# Patient Record
Sex: Female | Born: 1935 | Race: White | Hispanic: No | Marital: Married
Health system: Southern US, Community
[De-identification: ages and names within clinical notes are randomized; demographics above are authoritative.]

---

## 2012-11-22 ENCOUNTER — Emergency Department: Payer: Self-pay | Admitting: Emergency Medicine

## 2012-11-22 LAB — URINALYSIS, COMPLETE
Bilirubin,UR: NEGATIVE
Blood: NEGATIVE
Glucose,UR: NEGATIVE mg/dL (ref 0–75)
Leukocyte Esterase: NEGATIVE
Protein: 30
Squamous Epithelial: 3

## 2012-11-22 LAB — COMPREHENSIVE METABOLIC PANEL
Albumin: 4.4 g/dL (ref 3.4–5.0)
Anion Gap: 6 — ABNORMAL LOW (ref 7–16)
BUN: 22 mg/dL — ABNORMAL HIGH (ref 7–18)
Chloride: 107 mmol/L (ref 98–107)
Co2: 28 mmol/L (ref 21–32)
Creatinine: 0.89 mg/dL (ref 0.60–1.30)
EGFR (Non-African Amer.): >60
Glucose: 82 mg/dL (ref 65–99)
Osmolality: 284 (ref 275–301)
SGPT (ALT): 35 U/L (ref 12–78)
Sodium: 141 mmol/L (ref 136–145)

## 2012-11-22 LAB — DRUG SCREEN, URINE
Amphetamines, Ur Screen: NEGATIVE (ref ?–1000)
Benzodiazepine, Ur Scrn: NEGATIVE (ref ?–200)
MDMA (Ecstasy)Ur Screen: NEGATIVE (ref ?–500)
Methadone, Ur Screen: NEGATIVE (ref ?–300)
Opiate, Ur Screen: NEGATIVE (ref ?–300)
Phencyclidine (PCP) Ur S: NEGATIVE (ref ?–25)

## 2012-11-22 LAB — ETHANOL: Ethanol %: <0.003 % (ref 0.000–0.080)

## 2012-11-22 LAB — TSH: Thyroid Stimulating Horm: 2.44 u[IU]/mL

## 2012-11-22 LAB — CBC
MCH: 31.4 pg (ref 26.0–34.0)
Platelet: 208 10*3/uL (ref 150–440)
RBC: 4.19 10*6/uL (ref 3.80–5.20)
RDW: 13.1 % (ref 11.5–14.5)

## 2013-02-23 ENCOUNTER — Ambulatory Visit: Payer: Self-pay | Admitting: Internal Medicine

## 2013-02-26 LAB — CBC
MCV: 92 fL (ref 80–100)
RBC: 3.46 10*6/uL — ABNORMAL LOW (ref 3.80–5.20)
RDW: 12.7 % (ref 11.5–14.5)
WBC: 13.7 10*3/uL — ABNORMAL HIGH (ref 3.6–11.0)

## 2013-02-26 LAB — TROPONIN I: Troponin-I: <0.02 ng/mL

## 2013-02-26 LAB — COMPREHENSIVE METABOLIC PANEL
Alkaline Phosphatase: 101 U/L (ref 50–136)
Anion Gap: 5 — ABNORMAL LOW (ref 7–16)
BUN: 16 mg/dL (ref 7–18)
Calcium, Total: 9 mg/dL (ref 8.5–10.1)
Chloride: 106 mmol/L (ref 98–107)
Co2: 29 mmol/L (ref 21–32)
EGFR (African American): >60
Glucose: 95 mg/dL (ref 65–99)
Potassium: 3.4 mmol/L — ABNORMAL LOW (ref 3.5–5.1)
Sodium: 140 mmol/L (ref 136–145)
Total Protein: 7.2 g/dL (ref 6.4–8.2)

## 2013-02-26 LAB — URINALYSIS, COMPLETE
Nitrite: NEGATIVE
RBC,UR: <1 /HPF (ref 0–5)
Specific Gravity: 1.029 (ref 1.003–1.030)
WBC UR: 2 /HPF (ref 0–5)

## 2013-02-26 LAB — CK TOTAL AND CKMB (NOT AT ARMC): CK-MB: 1.7 ng/mL (ref 0.5–3.6)

## 2013-02-27 ENCOUNTER — Inpatient Hospital Stay: Payer: Self-pay | Admitting: Internal Medicine

## 2013-02-27 LAB — SEDIMENTATION RATE: Erythrocyte Sed Rate: 56 mm/hr — ABNORMAL HIGH (ref 0–30)

## 2013-02-28 LAB — CBC WITH DIFFERENTIAL/PLATELET
Basophil %: 0.3 %
HCT: 29.7 % — ABNORMAL LOW (ref 35.0–47.0)
HGB: 10 g/dL — ABNORMAL LOW (ref 12.0–16.0)
MCH: 30.9 pg (ref 26.0–34.0)
MCHC: 33.6 g/dL (ref 32.0–36.0)
MCV: 92 fL (ref 80–100)
Monocyte #: 0.8 x10 3/mm (ref 0.2–0.9)
Neutrophil #: 7 10*3/uL — ABNORMAL HIGH (ref 1.4–6.5)
RDW: 12.8 % (ref 11.5–14.5)
WBC: 9.7 10*3/uL (ref 3.6–11.0)

## 2013-02-28 LAB — BASIC METABOLIC PANEL
Anion Gap: 6 — ABNORMAL LOW (ref 7–16)
BUN: 9 mg/dL (ref 7–18)
Chloride: 114 mmol/L — ABNORMAL HIGH (ref 98–107)
Co2: 26 mmol/L (ref 21–32)
EGFR (African American): >60
EGFR (Non-African Amer.): >60
Glucose: 94 mg/dL (ref 65–99)
Osmolality: 289 (ref 275–301)
Sodium: 146 mmol/L — ABNORMAL HIGH (ref 136–145)

## 2013-03-04 LAB — CULTURE, BLOOD (SINGLE)

## 2013-03-16 ENCOUNTER — Inpatient Hospital Stay: Payer: Self-pay | Admitting: Specialist

## 2013-03-16 LAB — BASIC METABOLIC PANEL
BUN: 17 mg/dL (ref 7–18)
Calcium, Total: 9.2 mg/dL (ref 8.5–10.1)
Co2: 30 mmol/L (ref 21–32)
EGFR (African American): >60
Glucose: 101 mg/dL — ABNORMAL HIGH (ref 65–99)
Osmolality: 283 (ref 275–301)
Potassium: 4.1 mmol/L (ref 3.5–5.1)
Sodium: 141 mmol/L (ref 136–145)

## 2013-03-16 LAB — PROTIME-INR
INR: 1.1
Prothrombin Time: 13.9 secs (ref 11.5–14.7)

## 2013-03-16 LAB — CBC
HCT: 32.8 % — ABNORMAL LOW (ref 35.0–47.0)
MCH: 31 pg (ref 26.0–34.0)
MCV: 92 fL (ref 80–100)
Platelet: 230 10*3/uL (ref 150–440)
RBC: 3.58 10*6/uL — ABNORMAL LOW (ref 3.80–5.20)
RDW: 13.2 % (ref 11.5–14.5)
WBC: 6.8 10*3/uL (ref 3.6–11.0)

## 2013-03-16 LAB — APTT: Activated PTT: 30 secs (ref 23.6–35.9)

## 2013-03-17 LAB — BASIC METABOLIC PANEL
Anion Gap: 3 — ABNORMAL LOW (ref 7–16)
BUN: 12 mg/dL (ref 7–18)
Calcium, Total: 9 mg/dL (ref 8.5–10.1)
Co2: 28 mmol/L (ref 21–32)
Creatinine: 0.62 mg/dL (ref 0.60–1.30)
EGFR (African American): >60
Glucose: 109 mg/dL — ABNORMAL HIGH (ref 65–99)
Sodium: 138 mmol/L (ref 136–145)

## 2013-03-17 LAB — URINALYSIS, COMPLETE
Bilirubin,UR: NEGATIVE
Glucose,UR: NEGATIVE mg/dL (ref 0–75)
Nitrite: NEGATIVE
Protein: 30
Specific Gravity: 1.017 (ref 1.003–1.030)
Squamous Epithelial: NONE SEEN

## 2013-03-17 LAB — CBC WITH DIFFERENTIAL/PLATELET
Basophil #: 0 10*3/uL (ref 0.0–0.1)
Basophil %: 0.3 %
Eosinophil %: 0.1 %
HCT: 33.7 % — ABNORMAL LOW (ref 35.0–47.0)
Lymphocyte #: 0.7 10*3/uL — ABNORMAL LOW (ref 1.0–3.6)
Lymphocyte %: 5.4 %
MCH: 30.7 pg (ref 26.0–34.0)
MCHC: 33.6 g/dL (ref 32.0–36.0)
MCV: 92 fL (ref 80–100)
Neutrophil #: 11 10*3/uL — ABNORMAL HIGH (ref 1.4–6.5)
WBC: 12.4 10*3/uL — ABNORMAL HIGH (ref 3.6–11.0)

## 2013-03-18 LAB — CBC WITH DIFFERENTIAL/PLATELET
Eosinophil %: 0.1 %
HCT: 30.5 % — ABNORMAL LOW (ref 35.0–47.0)
HGB: 10.1 g/dL — ABNORMAL LOW (ref 12.0–16.0)
Lymphocyte #: 0.8 10*3/uL — ABNORMAL LOW (ref 1.0–3.6)
Lymphocyte %: 5.9 %
Neutrophil #: 12.3 10*3/uL — ABNORMAL HIGH (ref 1.4–6.5)
Neutrophil %: 88.3 %
RDW: 12.6 % (ref 11.5–14.5)
WBC: 14 10*3/uL — ABNORMAL HIGH (ref 3.6–11.0)

## 2013-03-18 LAB — BASIC METABOLIC PANEL
Calcium, Total: 8.6 mg/dL (ref 8.5–10.1)
EGFR (African American): >60
EGFR (Non-African Amer.): >60
Sodium: 136 mmol/L (ref 136–145)

## 2013-03-19 LAB — URINE CULTURE

## 2013-03-26 ENCOUNTER — Ambulatory Visit: Payer: Self-pay | Admitting: Internal Medicine

## 2013-04-25 ENCOUNTER — Ambulatory Visit: Payer: Self-pay | Admitting: Internal Medicine

## 2013-04-25 DEATH — deceased

## 2014-04-03 IMAGING — CR PELVIS - 1-2 VIEW
1 series · 1 of 1 positions shown · non-contrast
Comparison: none

REASON FOR EXAM: post op in PACU
COMMENTS:   Bedside (portable):Y

PROCEDURE:     DXR - DXR PELVIS AP ONLY  - March 17, 2013  [DATE]
RESULT:     Comparison: 03/16/2013

[ap]
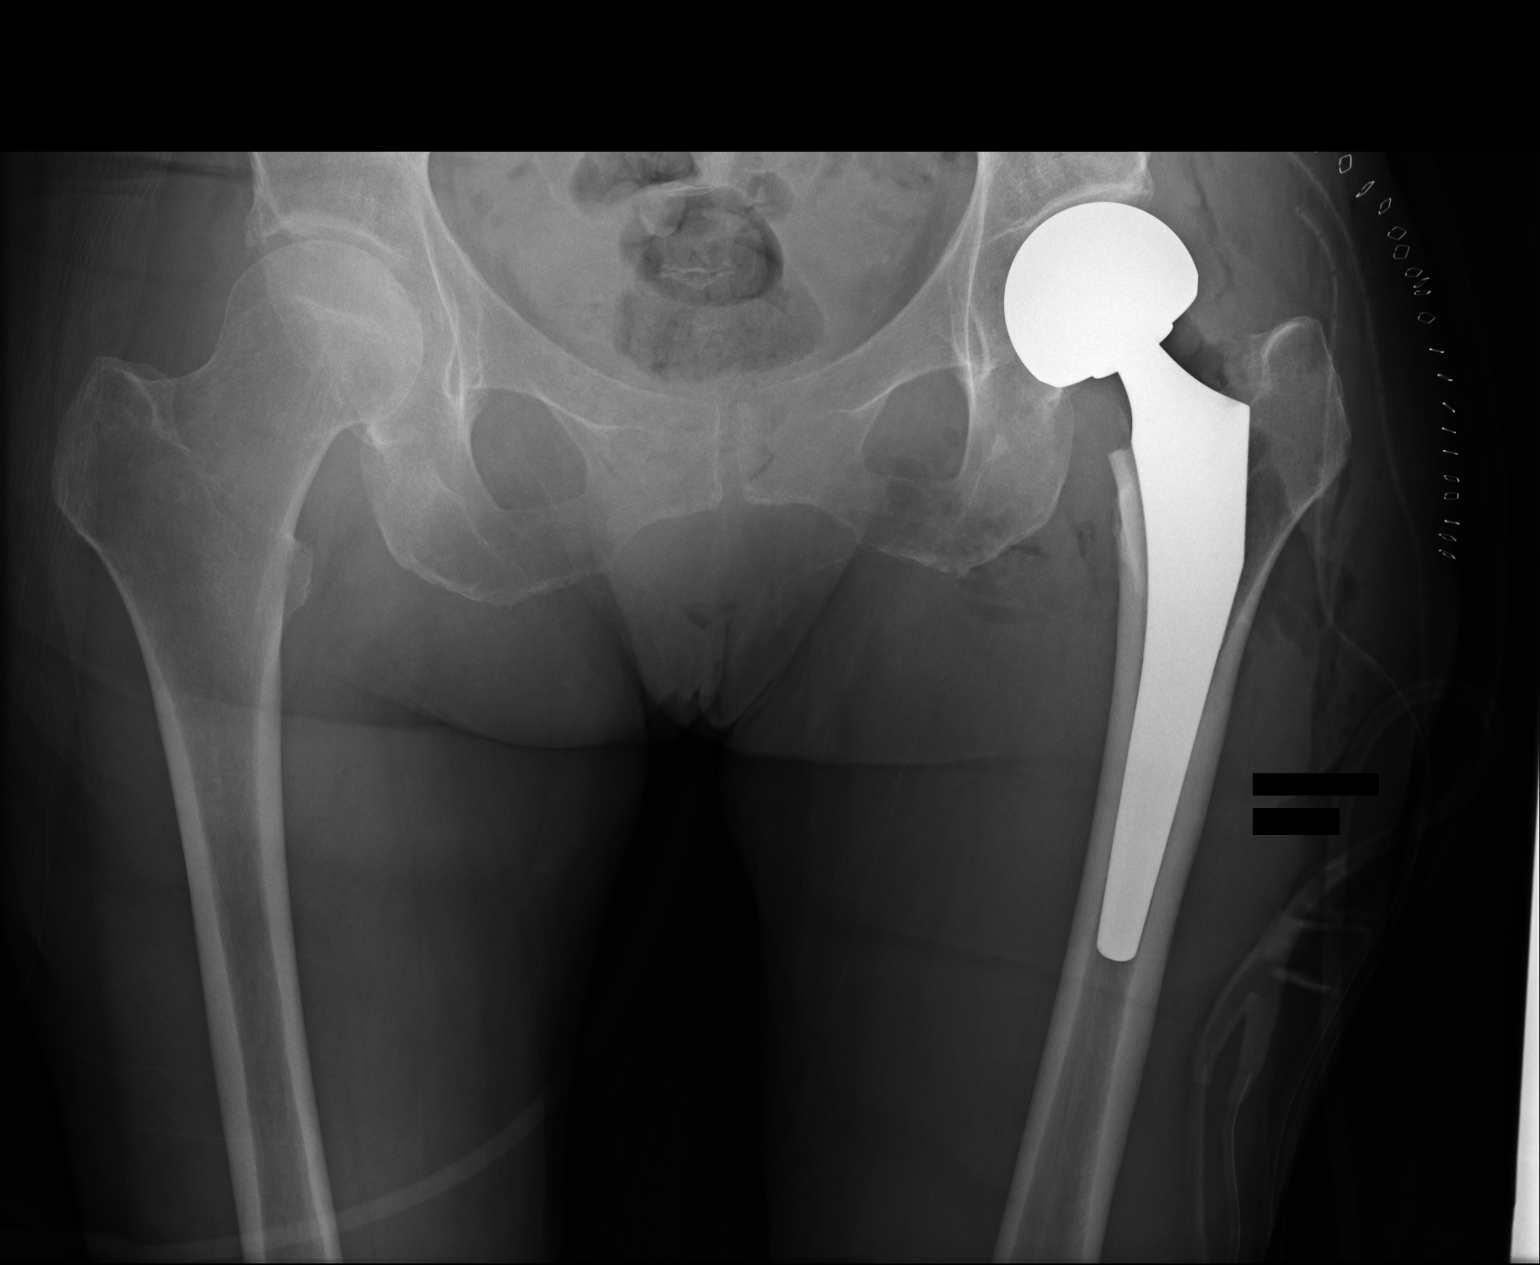

[1 of 1 positions shown; findings below may reference images not displayed]

FINDINGS: Hardware seen from left hip arthroplasty. Surgical drain and skin stables
are present. Soft tissue gas is likely postoperative.
IMPRESSION: Left hip arthroplasty.

[REDACTED]

## 2014-04-03 IMAGING — CR DG HIP 1V*L*
1 series · 1 of 1 positions shown · non-contrast
Comparison: none

REASON FOR EXAM: post op
COMMENTS:   Bedside (portable):Y

PROCEDURE:     DXR - DXR HIP LEFT ONE VIEW  - March 17, 2013  [DATE]
RESULT:     Comparison: 03/16/2013
Funnies:
Hardware seen from left hip arthroplasty. Skin staples and surgical drain
are present.

[lat]
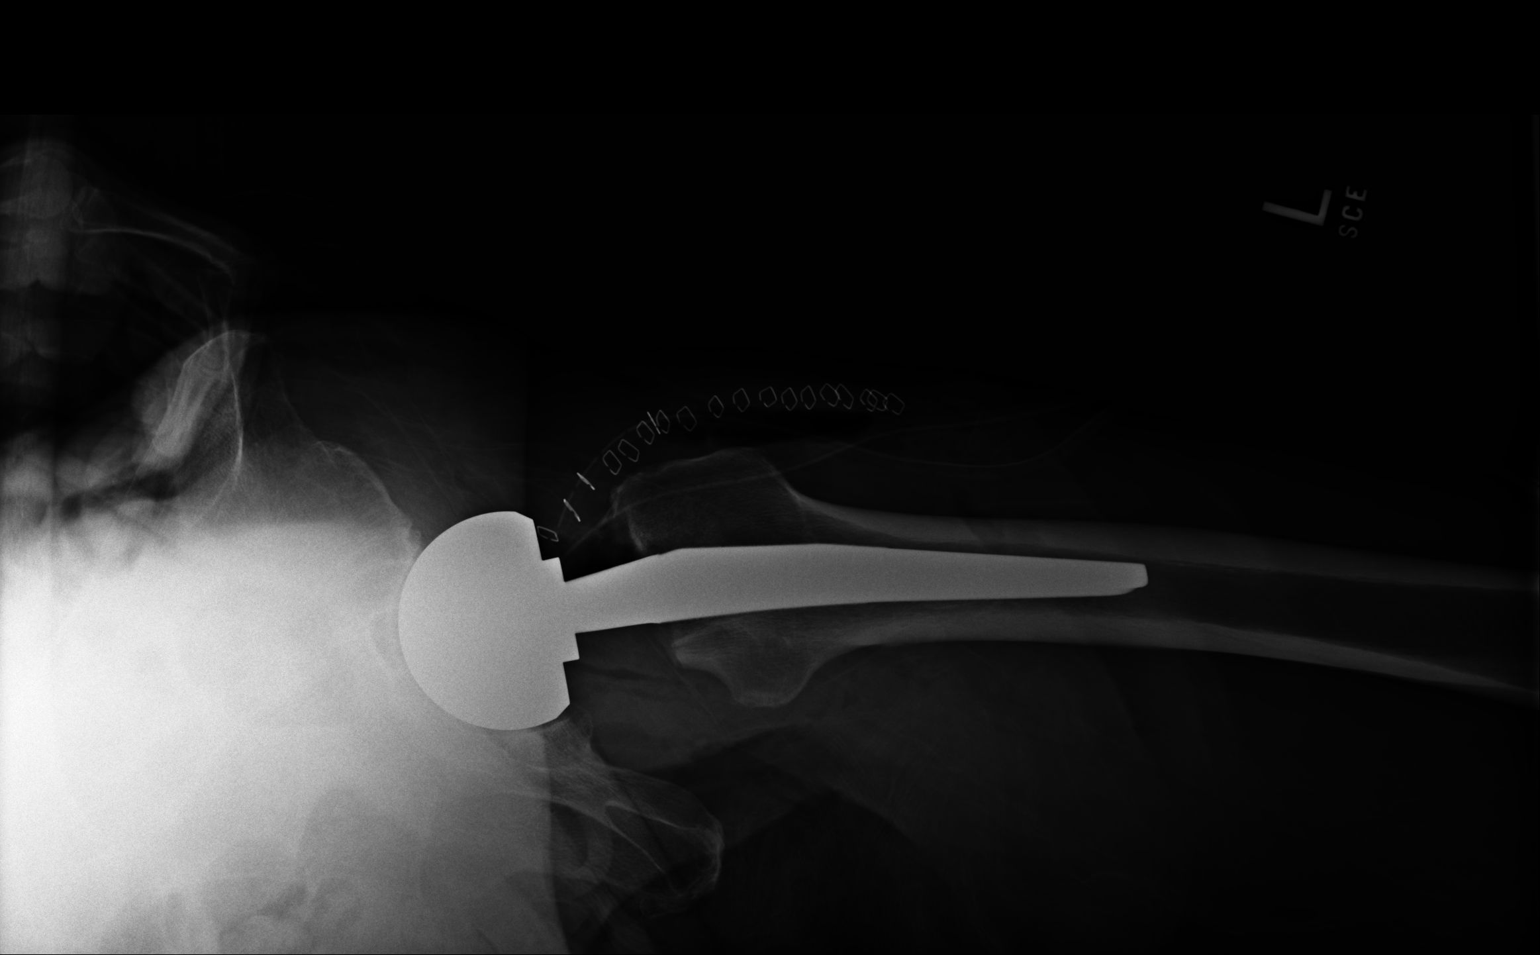

[1 of 1 positions shown; findings below may reference images not displayed]

IMPRESSION: Left hip arthroplasty.

[REDACTED]

## 2015-02-15 NOTE — Consult Note (Signed)
PATIENT NAME:  Molly Gordon, Molly Gordon MR#:  [id number] DATE OF BIRTH:  02-23-1934  DATE OF CONSULTATION:  03/16/2013  PRIMARY CARE PHYSICIAN:  Dr. Terance Hart  REFERRING PHYSICIAN:  Dr. Deeann Saint  CONSULTING PHYSICIAN:  Aamir Mclinden A. Allena Katz, MD  REASON FOR CONSULTATION:  Preop medical clearance.  The patient had sustained a left femoral fracture after accidental/mechanical fall.   HISTORY OF PRESENT ILLNESS: The patient is a pleasant, 81 year old, Caucasian female with Alzheimer's dementia, comes to the Emergency Room accompanied by her husband, who gives most of the history. The patient has dementia and not able to give any history or review of systems. Per husband, the patient was at their granddaughter's place and the patient was trying to get out of the house, out on the porch, accidentally missed a step/she lost balance and landed on the ground. She did not lose consciousness, did not have any head injury. She started having left hip pain and brought to the Emergency Room where she was found to have left femoral displaced fracture. Internal medicine was consulted for preop medical clearance.   The patient's husband reports she has no cardiac disease, any pulmonary disease or any other medical problems other than Alzheimer's dementia. The patient currently denies any chest pain or shortness of breath. Review of systems very limited due to dementia. The patient denies any chest pain, shortness of breath. She complains of mild hip pain. Could not do a detailed review of systems at this time.    ALLERGIES: No known drug allergies.   HOME MEDICATIONS: 1. Trazodone  100 mg 1-1/2 tablets at bedtime.  2. Risperdal 0.5 mg 2 tablets twice a day.  3. Lorazepam 0.5 mg 1 tablet twice a day.  4. Donepezil 5 mg 1 tablet at bedtime.  5. Ambien 5 mg at bedtime.   SOCIAL HISTORY: Lives at home with her husband. Nonsmoker. Nonalcoholic.   FAMILY HISTORY: Positive for hypertension.   PHYSICAL EXAMINATION: GENERAL:  The patient is awake, alert, oriented x 3, not in acute distress.   VITAL SIGNS: She is afebrile, pulse 80, blood pressure 141/73, sats are 96% on room air.   HEENT: Atraumatic, normocephalic. Pupils are equal, round and reactive to light and accommodation. EOM intact. Oral mucosa is moist.   NECK: Supple. No JVD. No carotid bruit.   LUNGS: Clear to auscultation bilaterally. No rales, rhonchi, respiratory distress or labored breathing.   HEART: Both the heart sounds are normal. Rate, rhythm regular. PMI not lateralized. Chest is nontender.   EXTREMITIES: Good pedal pulses, good femoral pulses. No lower extremity edema.   ABDOMEN: Soft, benign, nontender. No organomegaly. Positive bowel sounds.   NEUROLOGIC: Grossly intact cranial nerves II through XII. The patient is able to move all extremities well other than her left lower extremity. No focal neuro deficit noted.   SKIN: Warm and dry.   PSYCHIATRIC: The patient is awake. She is alert x 1.   LABORATORY DATA: PT-INR within normal limits. CBC: Hemoglobin and hematocrit is 11.1 and 32.8. White count is 6.8, platelet count 230. Glucose is 107, BUN 17, creatinine 0.9, sodium 141, potassium is 4.1, chloride 108, bicarbonate is 30. AP pelvis shows displaced left femoral neck fracture.   Chest x-ray shows minimal opacities in the right midlung, which is decreased from prior.   ASSESSMENT: A 80 year old patient with history of Alzheimer's disease and dementia comes into the Emergency Room after mechanical fall sustained a left femoral neck fracture. Internal medicine consulted for:  1. Preop medical clearance. The  patient is at low risk for surgery. She has no cardiac disease or any other underlying medical problems. Her EKG shows normal sinus rhythm with left axis deviation.  2. Left femoral neck fracture status post mechanical fall per Dr. Hyacinth MeekerMiller.  3. Recommended deep vein thrombosis prophylaxis, per Dr. Hyacinth MeekerMiller.  4. Mild elevated blood  pressure diagnosis of hypertension, likely due to stress from fracture and pain. We will continue to monitor if needed, consider starting antihypertensives.  5. CODE STATUS: Addressed and husband states the patient is a NO CODE, DO NOT RESUSCITATE.    TIME SPENT: 55 minutes.      ____________________________ Wylie HailSona A. Allena KatzPatel, MD sap:rw D: 03/16/2013 18:15:27 ET T: 03/16/2013 20:02:56 ET JOB#: [id number]  cc: Enzo Treu A. Allena KatzPatel, MD, <Dictator> Teena Iraniavid M. Terance HartBronstein, MD Valinda HoarHoward E. Miller, MD   Willow OraSONA A Frida Wahlstrom MD ELECTRONICALLY SIGNED 03/23/2013 14:50

## 2015-02-15 NOTE — Discharge Summary (Signed)
PATIENT NAME:  Darryl LentSHAMBLEY, Evy MR#:  [id number] DATE OF BIRTH:  [birth year]  DATE OF ADMISSION:  03/16/2013 DATE OF DISCHARGE:  03/21/2013  FINAL DIAGNOSES:  1.  Displaced subcapital fracture, left hip.  2. Advanced Alzheimer's dementia.   OPERATIONS: 03/17/2013: Left hip hemiarthroplasty, with a Stryker Accolade prosthesis.   COMPLICATIONS: None.   CONSULTATION: [facility].   HISTORY OF PRESENT ILLNESS: The patient is a 81 year old female who fell at home the day of admission, injuring the left hip. The day of admission she was brought to the Emergency Room where exam and x-rays revealed a displaced subcapital fracture of her left hip. She was admitted for medical evaluation and surgery. The patient lives at home with her husband, but has significant dementia.   PAST MEDICAL HISTORY: Illnesses, as above.   ALLERGIES: None.   MEDICATIONS: Trazodone 100 mg, 1-/2 at bedtime, Risperdal 0.5 mg, 2 twice a day, lorazepam 0.5 mg, 1 twice a day, donepezil 5 mg, 1 at bedtime, Ambien 5 mg bedtime.   REVIEW OF SYSTEMS: Unremarkable.   FAMILY HISTORY: Positive for hypertension.   SOCIAL HISTORY: The patient lives with her husband. Does not smoke or drink.   EXAM: This is a thin female in no acute distress. Affect is very flat and she is minimally communicative.   PHYSICAL EXAMINATION: VITAL SIGNS: Normal.   The left hip was painful range of motion. There was minimal shortening and minimal rotation. Neurovascular status was good distally.   LABORATORY DATA: Laboratory data on admission was satisfactory.   HOSPITAL COURSE: On 03/17/2013 the patient underwent a left hip hemiarthroplasty without problems. Hemoglobin remained stable and was down to 10.2 on the second postop day. She was mobilized to a chair and made slight progress with physical therapy.   She was stable and ready for skilled nursing transfer on 03/21/2013.   She will be partial weightbearing with a walker. Rehabilitation  potential is fair. She will need to eat and drink better. She will be seen in my office in two weeks for exam and x-rays.    ____________________________ Valinda HoarHoward E. Cambrie Sonnenfeld, MD hem:dm D: 03/21/2013 11:12:35 ET T: 03/21/2013 11:28:02 ET JOB#: [id number]  cc: Valinda HoarHoward E. Teaghan Melrose, MD, <Dictator> Teena Iraniavid M. Terance HartBronstein, MD Valinda HoarHOWARD E Joeann Steppe MD ELECTRONICALLY SIGNED 03/21/2013 13:13

## 2015-02-15 NOTE — Discharge Summary (Signed)
PATIENT NAME:  Molly Gordon, Molly Gordon MR#:  [id number] DATE OF BIRTH:  12/24/1933  DATE OF ADMISSION:  02/27/2013 DATE OF DISCHARGE:  03/01/2013  PATIENT'S PRIMARY CARE PHYSICIAN: Dr. Terance Hart.  FINAL DIAGNOSES: 1.  Dysphagia.  2.  Dementia.  3.  Pneumonia.  4.  Malnutrition.   MEDICATIONS ON DISCHARGE: Include: Risperdal 0.5 mg twice a day, donepezil 5 mg once a day at bedtime, trazodone 150 mg at bedtime, levofloxacin 750 mg every 48 hours until completed.   DIET: Regular diet with pureed honey-thick liquids; ice chips p.r.n. Strict aspiration precautions, medications in puree, monitoring all meals.   ACTIVITY: As tolerated.   FOLLOWUP: With Dr. Terance Hart in 1 to 2 weeks.   REFERRAL: To hospice at home.   HOSPITAL COURSE: The patient was admitted 02/27/2013 and discharged 03/01/2013 with inability to swallow and a poor appetite.   HISTORY OF PRESENT ILLNESS: A 81 year old female with a history of dementia, psychotic symptoms in the past, brought to the Emergency Department by her husband and son. Complains of inability to swallow and not eating in the last 3 or 4 days. She was unable to swallow liquids, and she was coughing when she would swallow. Unable to take her medications. For her dysphagia she was brought in to try to rule out a CVA. Neurology consultation was obtained. For pneumonia Levaquin was prescribed. For hypokalemia potassium was given, and IV fluids.     LABORATORY AND RADIOLOGICAL DATA: During the hospital course chest x-ray showed heterogeneous opacities, right middle lobe, left lower lobe. Could represent infection. EKG showed a sinus rhythm, short P-R, anteroseptal infarct. Troponin negative. White blood cell count 13.7, H and H  10.7 and 31.8, platelet count of 216, glucose 95, BUN 16, creatinine 1.03, sodium 140, potassium 3.4, chloride 106, CO2 29, calcium 9.0. Liver function tests normal range.   Urinalysis: Trace leukocyte esterase.   Blood cultures were negative.    Acetylcholine receptor antibodies: All levels negative. Sedimentation rate 56. C-reactive protein 90.6, vitamin B12 536. ANA negative.   MRI of the brain without contrast showed white matter changes consistent with chronic ischemia.   HOSPITAL COURSE PER PROBLEM LIST:  1.  For the patient's dysphagia, I am not sure if it really is a dysphagia. It could be just a progressive dementia going on. The patient was seen in consultation by speech therapy who kept her on a pureed diet with honey-thick liquids. She did eat very little for them. We did get a palliative care and hospice screen. Hospice will follow at home and do further evaluation and bring to the hospice home if declines  2.  For the patient's dementia, she is on Risperdal and donepezil. Decreased appetite and decreased eating is probably secondary to the dementia process. The husband did bring the patient into the hospital and was very concerned about bringing her home. I did have the care managers look into options here. They did set up with home hospice. They also looked into the [facility]. The patient was not rehabilitatable, and the patient could not be placed at this point and was not a candidate and off for the hospice home at that point.  3.  For the patient's pneumonia the patient was given Levaquin. As per the nurse, able to take p.o. and did take the p.o. Levaquin.  4. For the patient's severe protein-calorie malnutrition the patient will not improve with this secondary to the dementia and poor appetite. I do not think that this patient will do well.  Patient is a DO NOT RESUSCITATE. Likely at some point will be have to be upgraded to the hospice home.    Time spent on discharge: Thirty-five minutes.    ____________________________ Herschell Dimesichard J. Renae GlossWieting, MD rjw:dm D: 03/02/2013 14:38:05 ET T: 03/02/2013 15:11:55 ET JOB#: [id number]  cc: Herschell Dimesichard J. Renae GlossWieting, MD, <Dictator> Teena Iraniavid M. Terance HartBronstein, MD Salley ScarletICHARD J Tristen Luce  MD ELECTRONICALLY SIGNED 03/03/2013 20:03

## 2015-02-15 NOTE — Consult Note (Signed)
Brief Consult Note: Diagnosis: Pre-operative medical clearance for left femoral fracture, Alzeihmer's dementia.   Patient was seen by consultant.   Consult note dictated.   Recommend to proceed with surgery or procedure.   Comments: - pt is at low risk for surgery -DVT prophylaxis -resume home meds post op -no cardiac histroy or any other medical issuses per husband -code status addressed with husband. pt is DNR.  Electronic Signatures: Willow OraPatel, Genelle Economou A (MD)  (Signed 22-May-14 18:07)  Authored: Brief Consult Note   Last Updated: 22-May-14 18:07 by Willow OraPatel, Ednamae Schiano A (MD)

## 2015-02-15 NOTE — H&P (Signed)
PATIENT NAME:  Molly LentSHAMBLEY, Natilee MR#:  [id number] DATE OF BIRTH:  08-Jan-1934  DATE OF ADMISSION:  02/27/2013  PR[facility] CARE [facility]IC[facility]   REFERRING PHYSICIAN: Dr. Chiquita LothJade Sung  CHIEF COMPLAINT: Unable to swallow, poor appetite.   HISTORY OF PRESENT ILLNESS: The patient is a 81 year old female with a history of dementia with psychotic symptoms in the past. She is brought to the Emergency Department by her husband and son with complaints of unable to swallow, not eating for the last 3 to 4 days. The patient has eaten a very small amount in the last 72 hours. Even prior to that, the patient has been eating less than usual. In the last two days, the patient is unable to swallow liquids. Each time when she swallows she coughs. Unable to take her medications for the last two days secondary to cough and unable to swallow. Ate a small amount of food at different occasions.   The patient's family also noticed the patient having slurred speech and somewhat shuffled gait in the last few days. The patient walks around in the house all day long; however, her gait has changed in the last few days. Did not notice any fever. The patient denied having any pain in any part of the body. The patient's family did not notice her having any recent falls or weakness in any part of the body. Work-up in the Emergency Department is consistent with right lower and middle lobe pneumonia. The patient received Levaquin in the Emergency Department.   PAST MEDICAL HISTORY: Dementia.   ALLERGIES: No known drug allergies.   HOME MEDICATIONS: 1. Trazodone 150 mg at bedtime.  2. Risperidone 0.5 mg 2 times a day.  3. Lorazepam 0.5 mg 2 times a day.  4. Donepezil 5 mg once a day.  5. Ambien 5 mg daily.   SOCIAL HISTORY: No history of smoking, drinking alcohol or using illicit drugs. Married, lives with her husband, who is her primary care giver and medical power of attorney. Per the patient's husband, the  patient is DNR/DNI; however, does not have any living will.   FAMILY HISTORY: Could not be obtained from the patient.   Review of systems could not be obtained from the patient; however, is reviewed with the patient's husband except as mentioned in the history of present illness.  PHYSICAL EXAMINATION:  GENERAL: This is a cachectic looking female laying down in the bed, not in distress.  VITAL SIGNS: Temperature 99.3, pulse 78, blood pressure 113/54, respiratory rate of 16, oxygen saturation 98% on room air.  HEENT: Head normocephalic, atraumatic. Eyes: No scleral icterus. Conjunctivae normal. Pupils equal and react to light. Extraocular movements are intact. Bitemporal wasting. Mucous membranes moist. No pharyngeal erythema. Slow but symmetric elevation of the palate.  NECK: Supple. No lymphadenopathy. No JVD. No carotid bruit.  CHEST: Has no focal tenderness. Crackles are heard in the right lower lobes.  HEART: S1, S2 regular. No murmurs are heard.  EXTREMITIES: No lower extremity swelling. Pulses plus in the dorsalis pedis.  ABDOMEN: Bowel sounds plus. Soft, nontender, nondistended.  MUSCULOSKELETAL: Good range of motion in all the extremities.  SKIN: No rash or lesions.  LYMPHATIC: No cervical, axillary or inguinal lymphadenopathy.  NEURO: The patient oriented to self, person, and knows the hospital. Could not tell the date and time. Cranial nerves II through 12 intact. Motor 5/5 in upper and lower extremities. Romberg sign negative. Gait: Slow, shuffled gait. Has difficulty changing the direction of walking.  Lordotic posture. Muscle tone slightly increased. Reflexes somewhat diminished.   LABS: CBC: WBC of 13.7, hemoglobin 10.7, platelet count of 260. Urinalysis negative for nitrites and leukocyte esterase. Potassium 3.4. The rest of the values are normal on CMP. Cardiac enzymes are within normal limits.   ASSESSMENT AND PLAN: The patient is a 81 year old female who is brought to the  Emergency Department by her husband with decreased p.o. intake and cough with drinking thin liquids.  1.  Dysphagia: This seems to be sub-acute onset in the last 2 to 3 days. The differential diagnosis is CVA. Amyotrophic lateral sclerosis versus myasthenia gravis. The patient does not have any ocular symptoms. The patient does not have any focal neurologic deficits. Still concerned about amyotrophic lateral sclerosis presenting with the bulbar symptoms. However, will obtain MRI of the brain. We will obtain acetylcholine receptor antibodies, ANA, B12, CRP and sedimentation rate. Also consider getting a neurology consult. Will keep the patient n.p.o. Continue with IV fluids. We will get swallow evaluation in the morning.  2.  Pneumonia, aspiration: We will continue with Levaquin.  3.  Hypokalemia: Will replace by IV fluids.  4.  Dementia with psychotic symptoms: We will keep her on Haldol as needed.  5.  Keep the patient on deep vein thrombosis prophylaxis with Lovenox.   TIME SPENT: 55 minutes.    ____________________________ Susa Griffins, MD pv:aw D: 02/27/2013 03:30:13 ET T: 02/27/2013 06:12:40 ET JOB#: [id number]  cc: Susa Griffins, MD, <Dictator> Susa Griffins MD ELECTRONICALLY SIGNED 02/27/2013 7:24

## 2015-02-15 NOTE — Op Note (Signed)
PATIENT NAME:  Molly Gordon, Anniya MR#:  [id number] DATE OF BIRTH:  [birth year]  DATE OF PROCEDURE:  03/17/2013  PREOPERATIVE DIAGNOSIS: Displaced subcapital fracture of left hip.   POSTOPERATIVE DIAGNOSIS: Displaced subcapital fracture of left hip.   PROCEDURE PERFORMED: Left hip hemiarthroplasty (Stryker #4.5 Accolade stem, 50 mm unipolar head, standard neck length).   SURGEON: Valinda HoarHoward E. Arnett Duddy, M.D.   ANESTHESIA: Spinal.   COMPLICATIONS: None.   DRAINS: Two Hemovacs.   ESTIMATED BLOOD LOSS: 100 mL, replaced none.   DESCRIPTION OF PROCEDURE: The patient was brought to the Operating Room where she underwent satisfactory spinal anesthesia, was placed was in the right lateral decubitus position and padded appropriately on the beanbag. The left hip was prepped and draped in sterile fashion and a posterolateral incision made. Dissection was carried out sharply through subcutaneous tissue and fascia, which was retracted with a Charnley retractor. Short external rotators were released and tagged. The posterior capsule was incised in a T-fashion and retracted. The hip was internally rotated and the femoral head and neck dislocated and the head removed. The femoral neck was cut with the oscillating saw to appropriate an length and angle. The acetabulum was irrigated and cleared of debris. The excess soft tissue was debrided. The 49 and 50 mm femoral head trials were inserted and the 50 mm appeared to fit the best. The femoral shaft was then cleared with a curette and progressive rasps were inserted up to a 4.5 level. A trial reduction was then carried out with a 50 mm head and a standard neck length. This was stable and leg lengths appeared to be good. The trials were removed and the wound thoroughly irrigated. A #4.5 Stryker Accolade femoral stem was inserted in slight anteversion. The 50 mm unipolar head with a standard neck length was inserted and was articulated with this and the hip was reduced. It was  stable and leg lengths were virtually equal. After irrigation, the capsule was closed with #2 Tycron. The short rotators were repaired with similar suture. The deep fascia was closed with #2 Quill suture over a Hemovac drain and the subcutaneous tissue was closed with 0 quill over another Hemovac. Skin was closed with staples. Dry sterile dressing was applied and the Hemovac was activated. The patient was carefully rolled back onto her hospital bed and placed back in Buck's traction. The leg lengths showed only a millimeter or 2 of shortening on the left. The hip was stable. She was taken to recovery in good condition.   ____________________________ Valinda HoarHoward E. Tradarius Reinwald, MD hem:jm D: 03/17/2013 16:42:51 ET T: 03/17/2013 21:48:03 ET JOB#: [id number]  cc: Valinda HoarHoward E. Lessly Stigler, MD, <Dictator> Valinda HoarHOWARD E Kitana Gage MD ELECTRONICALLY SIGNED 03/18/2013 13:34

## 2015-02-15 NOTE — H&P (Signed)
Subjective/Chief Complaint Left hip pain   History of Present Illness 81 year old female tripped on step and fell on left hip today.  Brought to Emergency Room where exam and X-rays show a displaced subcapital fracture left hip.  Discussed treatment with husband and patient who has some dementia and recommended hemiarthroplasty.  They are in agreement with this.  Risks and benefits of surgery were discussed at length including but not limited to infection, non union, nerve or blood vessed damage, non union, need for repeat surgery, blood clots and lung emboli, and death.  Plan to admit for medical evaluation and surgery in AM.   Primary Physician Bronstein   Past Med/Surgical Hx:  Alzheimer's Disease:   Denies surgical history.:   ALLERGIES:  No Known Allergies:   HOME MEDICATIONS: Medication Instructions Status  risperiDONE 0.5 mg oral tablet 2 tab(s) orally 2 times a day Active  traZODone 100 mg oral tablet 1.5 tab(s) orally once a day (at bedtime) Active  Ambien 5 mg oral tablet 1 tab(s) orally once a day (at bedtime) Active  LORazepam 0.5 mg oral tablet 1 tab(s) orally 2 times a day Active  donepezil 5 mg oral tablet 1 tab(s) orally once a day (at bedtime) Active   Family and Social History:  Family History Non-Contributory   Social History negative tobacco, negative ETOH   Place of Living Home   Review of Systems:  Fever/Chills No   Cough No   Sputum No   Abdominal Pain No   Physical Exam:  GEN well developed, well nourished   HEENT pink conjunctivae   NECK supple   RESP normal resp effort   CARD regular rate   ABD denies tenderness   GU foley catheter in place   LYMPH negative neck   EXTR negative edema, Left leg painful to range of motion.  circulation/sensation/motor function good.  No real shortening.   SKIN normal to palpation   NEURO motor/sensory function intact   PSYCH alert, poor insight, sedated   Lab Results: Routine Chem:   22-May-14 16:06   Glucose, Serum  101  BUN 17  Creatinine (comp) 0.90  Sodium, Serum 141  Potassium, Serum 4.1  Chloride, Serum  108  CO2, Serum 30  Calcium (Total), Serum 9.2  Anion Gap  3  Osmolality (calc) 283  eGFR (African American) >60  eGFR (Non-African American) >60 (eGFR values <71m/min/1.73 m2 may be an indication of chronic kidney disease (CKD). Calculated eGFR is useful in patients with stable renal function. The eGFR calculation will not be reliable in acutely ill patients when serum creatinine is changing rapidly. It is not useful in  patients on dialysis. The eGFR calculation may not be applicable to patients at the low and high extremes of body sizes, pregnant women, and vegetarians.)  Routine Coag:  22-May-14 16:06   Activated PTT (APTT) 30.0 (A HCT value >55% may artifactually increase the APTT. In one study, the increase was an average of 19%. Reference: "Effect on Routine and Special Coagulation Testing Values of Citrate Anticoagulant Adjustment in Patients with High HCT Values." American Journal of Clinical Pathology 2006;126:400-405.)  Prothrombin 13.9  INR 1.1 (INR reference interval applies to patients on anticoagulant therapy. A single INR therapeutic range for coumarins is not optimal for all indications; however, the suggested range for most indications is 2.0 - 3.0. Exceptions to the INR Reference Range may include: Prosthetic heart valves, acute myocardial infarction, prevention of myocardial infarction, and combinations of aspirin and anticoagulant. The  need for a higher or lower target INR must be assessed individually. Reference: The Pharmacology and Management of the Vitamin K  antagonists: the seventh ACCP Conference on Antithrombotic and Thrombolytic Therapy. JEADG.7354 Sept:126 (3suppl): [id number]. A HCT value >55% may artifactually increase the PT.  In one study,  the increase was an average of 25%. Reference:  "Effect on Routine and  Special Coagulation Testing Values of Citrate Anticoagulant Adjustment in Patients with High HCT Values." American Journal of Clinical Pathology 2006;126:400-405.)  Routine Hem:  22-May-14 16:06   WBC (CBC) 6.8  RBC (CBC)  3.58  Hemoglobin (CBC)  11.1  Hematocrit (CBC)  32.8  Platelet Count (CBC) 230 (Result(s) reported on 16 Mar 2013 at 04:25PM.)  MCV 92  MCH 31.0  MCHC 33.8  RDW 13.2   Radiology Results: XRay:    22-May-14 15:48, Hip Left Complete  Hip Left Complete  REASON FOR EXAM:    fall left hip pain  COMMENTS:       PROCEDURE: DXR - DXR HIP LEFT COMPLETE  - Mar 16 2013  3:48PM     RESULT: Comparison: None.    Findings:  There is a displaced subcapital fracture of the left femoral neck.    IMPRESSION:   Displaced left femoral neck fracture.    Dictation site: 2    Verified By: Gregor Hams, M.D., MD    [phone] 15:48, Pelvis AP Only  Pelvis AP Only  REASON FOR EXAM:    fall left hip pain  COMMENTS:       PROCEDURE: DXR - DXR PELVIS AP ONLY  - Mar 16 2013  3:48PM     RESULT: Comparison: None.    Findings:  There is a displaced subcapital fracture of the left femoral neck.    IMPRESSION:   Displaced left femoral neck fracture.    Dictation site: 2    Verified By: Gregor Hams, M.D., MD  LabUnknown:    22-May-14 15:48, Hip Left Complete  PACS Image    22-May-14 15:48, Pelvis AP Only  PACS Image    Assessment/Admission Diagnosis Displaced subcapital fracture left hip   Plan Left hip hemiarthroplasty in AM.   Electronic Signatures: Park Breed (MD)  (Signed 22-May-14 17:37)  Authored: CHIEF COMPLAINT and HISTORY, PAST MEDICAL/SURGIAL HISTORY, ALLERGIES, HOME MEDICATIONS, FAMILY AND SOCIAL HISTORY, REVIEW OF SYSTEMS, PHYSICAL EXAM, LABS, Radiology, ASSESSMENT AND PLAN   Last Updated: 22-May-14 17:37 by Park Breed (MD)

## 2015-02-15 NOTE — Consult Note (Signed)
Referring Physician:  Monica Becton :   Primary Care Physician:  Monica Becton [facility], [address], [place], [place] [address], [place] [phone]  Reason for Consult: Admit Date: 26-Feb-2013  Chief Complaint: Encephalopathy, dysphagia  Reason for Consult: dysphagia   History of Present Illness: History of Present Illness:   HISTORY OF PRESENT ILLNESS: year old woman with a history of advanced dementia with psychosis presents to [facility] with new dysphagia.  She has not been eating for a few days and prior to this apparently she had been eating less than normal.  She cannot swallow liquids.  She is coughing and choking when trying to swallow, including her medications.  In addition, she has had some slurred speech and shuffling gait as well.  Apparently over the last few days her family has noticed that her gait has become more shuffling in nature.  No recnet falls or clear weakness identified.  In the ED she was found to have RLL and RML pneumonia and has been placed on levaquin.  Symptoms are moderate to severe.  Nothing makes them better or worse.  Per nursing the patient has been doing somewhat better today, able to walk to the bathroom with assistance.  Very confused due to her dementia.  Received haldol this AM due to agitation.   MEDICAL HISTORY:  SURGICAL HISTORY:patient unable to provide this history to me due to dementia. HISTORY: Per records, no history of alcohol, tobacco or drugs. Married and lives with her husband in their own home.  He is her primary care giver and medical power of attorney. Per the patient's husband reported to primary team that the patient is DNR/DNI, no living will. HISTORY: Could not be obtained from the patient due to dementia.  MEDICATIONS: 1. Trazodone 150 mg at bedtime.  Risperidone 0.5 mg 2 times a day.  Lorazepam 0.5 mg 2 times a day.  Donepezil 5 mg once a day.  Ambien 5 mg daily.  No known drug allergies.   EXAM   woman in  NAD.  Normocephalic and atraumatic. exam shows normal disc size, appearance and C/D ratio without clear evidence of papilledema. and S2 sounds are within normal limits, without murmurs, gallops, or rubs. - Thin- NormalDrift - Unable to follow command due to dementia.- Deferred since patient is on fall precautions.  4/4    Shoulder abduction (deltoid/supraspinatus, axillary/suprascapular n, C5) 4/4    Elbow flexion (biceps brachii, musculoskeletal n, C5-6) 4/4    Elbow extension (triceps, radial n, C7) 4/4    Finger adduction (interossei, ulnar n, T1)  4/4    Hip flexion (iliopsoas, L1/L2) 4/4    Knee flexion (hamstrings, sciatic n, L5/S1) 4/4    Knee extension (quadriceps, femoral n, L3/4) 4/4    Ankle dorsiflexion (tibialis anterior, deep fibular n, L4/5) 4/4    Ankle plantarflexion (gastroc, tibial n, S1) STATUS:is oriented to person and the name of her husband only.  Unable to identify the day, month, year, where she is (such as name of hospital or in a hospital) or why she is here.  Recent memory is severely diminished.  Remote memory is moderately reduced.  Attention span and concentration are moderately decreased.  Naming, repetition, comprehension and expressive speech are within normal limits.   NERVES:   CN II (normal visual acuity and visual fields)   CN III, IV, VI (extraocular muscles are intact)   CN V (facial sensation is intact bilaterally)   CN VII (facial strength is intact bilaterally)  CN VIII (hearing is intact bilaterally)   CN IX/X (palate elevates midline, normal phonation)   CN XI (shoulder shrug strength is normal and symmetric)   CN XII (tongue protrudes midline) to pain and temp bilaterally (spinothalamic tracts)to position and vibration bilaterally (dorsal columns)    Biceps   Brachioradialis    Patellar   Achilles  Unable to test as patient does not follow this command due to her dementia.  81 year old woman with a history of advanced dementia with psychosis  presents to [facility] with new dysphagia.   Overall, it seems that this patients dysphagia has been of gradual onset although it has recently significantly worsened to the point where the patient is unable to produce any swallowing.  This patient has advanced dementia and during this hospitalization has been found to have pneumonia.  I have personally reviewed her Brain MRI which is negative for stroke.  I have reviewed her EKG which shows NSR.  Labs are unremarkable.  In patients with mild dysphagia, pneumonia can worsen dysphagia severity.  In a patient with advanced dementia at her level I would expect at least some baseline level of dysphagia.  The pathophys of dysphagia in patients with advanced dementia is multifaceted and includes apraxia of the mouth, tongue and lips, and apraxia of the pharynx often with delayed swallow.  In patients with advanced dementia, pneumonia or other infection can worsen/exacerbate these underlying problems.  There is no focal weakness or fascics to suggest ALS.  In a patient with advanced dementia, would only pursue further ALS workup with EMG/NCS if clear signs and symptoms and if desired by POA.  With patient of DNR status with advanced dementia, the pain associated with the test with limited to no survival benefit even if diagnosed, would argue again pursuing this workup.  IN any case, this would be an outpatient workup.  Would rec treating infection and providing ST approved oral regimen or tube feeds if unable to tolerate normal PO after pneumonia has resolved.  Would suggest palliative consult during the hospitalization even if just for information regarding end of life care options.   Melrose Nakayama, MD   ROS:  General denies complaints   HEENT no complaints   Lungs no complaints   Cardiac no complaints   GI no complaints   GU no complaints   Musculoskeletal no complaints   Extremities no complaints   Skin no complaints   Endocrine no complaints   Past  Medical/Surgical Hx:  Alzheimer's Disease:   Denies surgical history.:   Home Medications: Medication Instructions Last Modified Date/Time  LORazepam 0.5 mg oral tablet  orally 2 times a day 05-May-14 00:35  risperiDONE 0.5 mg oral tablet 1 tab(s) orally 2 times a day 05-May-14 00:36  Ambien 5 mg oral tablet 1 tab(s) orally once a day (at bedtime) 05-May-14 00:36  donepezil 5 mg oral tablet 1 tab(s) orally once a day (at bedtime) 05-May-14 00:36  traZODone 150 mg oral tablet 1 tab(s) orally once a day (at bedtime) 05-May-14 00:36   [facility] Current Meds:  Sodium Chloride 0.9%, 1000 ml at 40 ml/hr  Acetaminophen * tablet, ( Tylenol (325 mg) tablet)  650 mg Oral q4h PRN for pain or temp. greater than 100.4  - Indication: Pain/Fever  Docusate Sodium capsule, ( Colace)  100 mg Oral bid PRN for constipation  - Indication: Stool Softener  Enoxaparin injection, ( Lovenox injection )  40 mg, Subcutaneous, daily  Indication: Prophylaxis or treatment of thromboembolic disorders,  Monitor Anticoags per hospital protocol  Ondansetron injection, ( Zofran injection )  4 mg, IV push, q4h PRN for Nausea/Vomiting  Indication: Nausea/ Vomiting  Senna tablet, ( Senokot)  1 tablet(s) Oral bid PRN for constipation  - Indication: Stool Softner/ Constipation/ Bowel Prep for Surgery  Instructions:  1 tablet = 8.6 mg  Pneumococcal 23-valent Vaccine, 0.5 ml, Intramuscular, atdischarge  Indication: Pneumococcal Immunization, 0.7m IM once (Stored in Pyxis Refrigerator)  Allergies:  No Known Allergies:   Vital Signs: **Vital Signs.:   06-May-14 13:14  Vital Signs Type Routine  Temperature Temperature (F) 98  Celsius 36.6  Temperature Source oral  Pulse Pulse 79  Respirations Respirations 18  Systolic BP Systolic BP 1762 Diastolic BP (mmHg) Diastolic BP (mmHg) 60  Mean BP 73  Pulse Ox % Pulse Ox % 98  Pulse Ox Activity Level  At rest  Oxygen Delivery Room Air/ 21 %   Lab Results:  Hepatic:   04-May-14 20:34   Bilirubin, Total 0.6  Alkaline Phosphatase 101  SGPT (ALT) 24  SGOT (AST) 21  Total Protein, Serum 7.2  Albumin, Serum  3.2  Routine Micro:  05-May-14 01:30   Micro Text Report BLOOD CULTURE   COMMENT                   NO GROWTH IN 18-24 HOURS   ANTIBIOTIC                       Culture Comment NO GROWTH IN 18-24 HOURS  Result(s) reported on 28 Feb 2013 at 07:15AM.    02:08   Micro Text Report BLOOD CULTURE   COMMENT                   NO GROWTH IN 18-24 HOURS   ANTIBIOTIC                       Culture Comment NO GROWTH IN 18-24 HOURS  Result(s) reported on 28 Feb 2013 at 07:16AM.  General Ref:  05-May-14 05:15   Antinuclear Antibodies Direct ========== TEST NAME ==========  ========= RESULTS =========  = REFERENCE RANGE =  ANTI-NUCLEAR ABS DIRECT  Antinuclear Antibodies Direct ANA Direct                      [   Negative             ]          Negative               [facility]             No: [id number]           [address] [place] [address]          WLindon Romp MD         [phone]  Result(s) reported on 28 Feb 2013 at 03:20PM.  Vitamin B12, Serum ========== TEST NAME ==========  ========= RESULTS =========  = REFERENCE RANGE =  VITAMIN B12  Vitamin B12 Vitamin B12                     [   536 pg/mL            ]           [id number]               [facility]  [place]            No: [id number]           [address] [place], [place] [address]           Lindon Romp, MD         [phone]   Result(s) reported on 28 Feb 2013 at 06:47AM.  C-Reactive Protein ========== TEST NAME ==========  ========= RESULTS =========  = REFERENCE RANGE =  C-REACTIVE PROTEIN,QUANT  C-Reactive Protein, Quant C-Reactive Protein, Quant       [H  90.6 mg/L            ]           0.0-4.9               [facility]            No: [id number]           [address], [place], [place] [address]           Lindon Romp, MD         [phone]   Result(s) reported on 28 Feb 2013 at 06:47AM.  Cardiology:  04-May-14 20:29   Ventricular Rate 85  Atrial Rate 85  P-R Interval 106  QRS Duration 80  QT 322  QTc 383  P Axis 27  R Axis 7  T Axis 84  ECG interpretation Sinus rhythm with short PR Possible Anteroseptal infarct (cited on or before 22-Nov-2012) Abnormal ECG When compared with ECG of 22-Nov-2012 16:23, Premature atrial complexes are no longer Present Questionable change in initial forces of Septal leads ST less depressed in Inferior leads ----------unconfirmed---------- Confirmed by OVERREAD, NOT ([id number]), editor PEARSON, BARBARA ([id number]) on 02/27/2013 2:43:43 PM  Routine Chem:  04-May-14 20:34   Glucose, Serum 95  BUN 16  Creatinine (comp) 1.03  Sodium, Serum 140  Potassium, Serum  3.4  Chloride, Serum 106  CO2, Serum 29  Calcium (Total), Serum 9.0  Anion Gap  5  Osmolality (calc) 280  eGFR (African American) >60  eGFR (Non-African American)  53 (eGFR values <48m/min/1.73 m2 may be an indication of chronic kidney disease (CKD). Calculated eGFR is useful in patients with stable renal function. The eGFR calculation will not be reliable in acutely ill patients when serum creatinine is changing rapidly. It is not useful in  patients on dialysis. The eGFR calculation may not be applicable to patients at the low and high extremes of body sizes, pregnant women, and vegetarians.)  06-May-14 05:37   Glucose, Serum 94  BUN 9  Creatinine (comp) 0.77  Sodium, Serum  146  Potassium, Serum 4.1  Chloride, Serum  114  CO2, Serum 26  Calcium (Total), Serum 8.8  Anion Gap  6  Osmolality (calc) 289  eGFR (African American) >60  eGFR (Non-African American) >60 (eGFR values <659mmin/1.73 m2 may be an indication of chronic kidney disease (CKD). Calculated eGFR is useful in patients with stable renal function. The eGFR calculation will not be reliable in acutely ill patients when serum  creatinine is changing rapidly. It is not useful in  patients on dialysis. The eGFR calculation may not be applicable to patients at the low and high extremes of body sizes, pregnant women, and vegetarians.)  Cardiac:  04-May-14 20:34   CK, Total 96  CPK-MB, Serum 1.7 (Result(s) reported on 26 Feb 2013 at 09:04PM.)  Troponin I < 0.02 (0.00-0.05 0.05 ng/mL or less: NEGATIVE  Repeat testing in 3-6 hrs  if clinically indicated. >0.05 ng/mL: POTENTIAL  MYOCARDIAL INJURY. Repeat  testing in 3-6 hrs if  clinically indicated. NOTE: An increase or decrease  of 30% or more on serial  testing suggests a  clinically important change)  Routine UA:  04-May-14 21:29   Color (UA) Yellow  Clarity (UA) Hazy  Glucose (UA) Negative  Bilirubin (UA) Negative  Ketones (UA) Negative  Specific Gravity (UA) 1.029  Blood (UA) Negative  pH (UA) 5.0  Protein (UA) 30 mg/dL  Nitrite (UA) Negative  Leukocyte Esterase (UA) Trace (Result(s) reported on 26 Feb 2013 at 09:41PM.)  RBC (UA) <1 /HPF  WBC (UA) 2 /HPF  Bacteria (UA) TRACE  Epithelial Cells (UA) 4 /HPF  Mucous (UA) PRESENT (Result(s) reported on 26 Feb 2013 at 09:41PM.)  Routine Hem:  04-May-14 20:34   WBC (CBC)  13.7  RBC (CBC)  3.46  Hemoglobin (CBC)  10.7  Hematocrit (CBC)  31.8  Platelet Count (CBC) 260 (Result(s) reported on 26 Feb 2013 at 08:57PM.)  MCV 92  MCH 30.8  MCHC 33.5  RDW 12.7  05-May-14 05:15   Erythrocyte Sed Rate  56 (Result(s) reported on 27 Feb 2013 at 06:37AM.)  06-May-14 05:37   WBC (CBC) 9.7  RBC (CBC)  3.24  Hemoglobin (CBC)  10.0  Hematocrit (CBC)  29.7  Platelet Count (CBC) 253  MCV 92  MCH 30.9  MCHC 33.6  RDW 12.8  Neutrophil % 72.4  Lymphocyte % 18.3  Monocyte % 8.2  Eosinophil % 0.8  Basophil % 0.3  Neutrophil #  7.0  Lymphocyte # 1.8  Monocyte # 0.8  Eosinophil # 0.1  Basophil # 0.0 (Result(s) reported on 28 Feb 2013 at 06:11AM.)   Radiology Results: MRI:    06-May-14 10:02, MRI Brain  Without Contrast  MRI Brain Without Contrast   REASON FOR EXAM:    Dysphagea  COMMENTS:       PROCEDURE: MR  - MR BRAIN WO CONTRAST  - Feb 28 2013 10:02AM     RESULT: History: Dysphagia.     Comparison: Head CT of 11/22/2012.    Findings: Multiplanar, multisequence imaging of the brain is obtained.   Diffusion-weighted images reveal no evidence of acute ischemia.   Subcortical and deep white matter changes noted consistent with chronic   ischemia. Vascular flow voids are normal. Cranio- vertebral junction is   normal.  IMPRESSION:     Impression: White matter changes consistent chronic ischemia .        Verified By: Osa Craver, M.D., MD   Electronic Signatures: Anabel Bene (MD)  (Signed 06-May-14 21:57)  Authored: REFERRING PHYSICIAN, Primary Care Physician, Consult, History of Present Illness, Review of Systems, PAST MEDICAL/SURGICAL HISTORY, HOME MEDICATIONS, Current Medications, ALLERGIES, NURSING VITAL SIGNS, LAB RESULTS, RADIOLOGY RESULTS   Last Updated: 06-May-14 21:57 by Anabel Bene (MD)
